# Patient Record
Sex: Female | Born: 2000 | Race: Black or African American | Hispanic: No | Marital: Single | Smoking: Never smoker
Health system: Southern US, Community
[De-identification: ages and names within clinical notes are randomized; demographics above are authoritative.]

## PROBLEM LIST (undated history)

## (undated) DIAGNOSIS — J45909 Unspecified asthma, uncomplicated: Secondary | ICD-10-CM

## (undated) HISTORY — PX: OTHER SURGICAL HISTORY: SHX169

---
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
## 2019-06-02 ENCOUNTER — Ambulatory Visit (LOCAL_COMMUNITY_HEALTH_CENTER): Payer: BC Managed Care – PPO | Admitting: Physician Assistant

## 2019-06-02 ENCOUNTER — Encounter: Payer: Self-pay | Admitting: Physician Assistant

## 2019-06-02 ENCOUNTER — Other Ambulatory Visit: Payer: Self-pay

## 2019-06-02 VITALS — BP 122/68 | Ht 66.0 in | Wt 130.0 lb

## 2019-06-02 DIAGNOSIS — Z3046 Encounter for surveillance of implantable subdermal contraceptive: Secondary | ICD-10-CM | POA: Diagnosis not present

## 2019-06-02 DIAGNOSIS — Z30011 Encounter for initial prescription of contraceptive pills: Secondary | ICD-10-CM

## 2019-06-02 DIAGNOSIS — Z3009 Encounter for other general counseling and advice on contraception: Secondary | ICD-10-CM | POA: Diagnosis not present

## 2019-06-02 MED ORDER — NORETHIN ACE-ETH ESTRAD-FE 1-20 MG-MCG PO TABS: 1.0000 | ORAL_TABLET | Freq: Every day | ORAL | 12 refills | Status: DC

## 2019-06-02 NOTE — Progress Notes (Signed)
Last physical at [facility] was 05/22/2018. Nexplanon insertion 02/11/2019. Pt to [facility] to have nexplanon removed due to headaches; pt desires low dose OCP. Pt states she had sore breast the whole time she was taking Cryselle, desires a different OCP. Last sex was approximately 1 month ago.

## 2019-06-02 NOTE — Progress Notes (Signed)
Pt manual BP 122/68. Pt counseled and then provided Rx written by provider for Junel 1/20 Fe. Pt signed OCP consent. Provider orders completed.

## 2019-06-03 NOTE — Progress Notes (Signed)
Family Planning Visit- Repeat Yearly Visit  Subjective:  Daisy Schultz is a 18 y.o. being seen today for an well woman visit and to change birth control method.    She is currently using Nexplanon  for pregnancy prevention. Patient reports she does not  want a pregnancy in the next year. Patient  has Asthma on their problem list.  Chief Complaint  Patient presents with  . Contraception    desires nexplanon removal and start low dose OCP    Patient reports that she had had a lot of headaches since the Nexplanon insertion on 09/11/2018 and would like it removed.  Previously on OCs with breast tenderness so would like lower dose OC this time.  Last RP 04/2018, No periods with the Nexplanon, CBE due 2021 and pap due 2022. Reports that she was diagnosed with tension headaches by PCP.  Patient denies any changes to personal or family history since last RP.  Denies symptoms or concerns.  Declines STD screening today.   Does the patient desire a pregnancy in the next year? (OKQ flowsheet)  See flowsheet for other program required questions.   Body mass index is 20.98 kg/m. - Patient is eligible for diabetes screening based on BMI and age >89?  not applicable FY1O ordered? not applicable  Patient reports 1 of partners in last year. Desires STI screening?  No - .  Does the patient have a current or past history of drug use? No   No components found for: HCV]   Health Maintenance Due  Topic Date Due  . CHLAMYDIA SCREENING  02/07/2015  . HIV Screening  02/07/2015  . INFLUENZA VACCINE  01/25/2019  . TETANUS/TDAP  02/07/2019    Review of Systems  All other systems reviewed and are negative.   The following portions of the patient's history were reviewed and updated as appropriate: allergies, current medications, past family history, past medical history, past social history, past surgical history and problem list. Problem list updated.  Objective:   Vitals:   06/02/19 1316 06/02/19  1318 06/02/19 1430  BP: (!) 155/78 132/69 122/68  Weight: 130 lb (59 kg)    Height: 5\' 6"  (1.676 m)      Physical Exam Vitals signs and nursing note reviewed.  Constitutional:      General: She is not in acute distress.    Appearance: Normal appearance. She is normal weight.  HENT:     Head: Normocephalic and atraumatic.  Neck:     Musculoskeletal: Neck supple. No muscular tenderness.  Pulmonary:     Effort: Pulmonary effort is normal.  Abdominal:     Palpations: Abdomen is soft. There is no mass.     Tenderness: There is no abdominal tenderness. There is no guarding or rebound.  Lymphadenopathy:     Cervical: No cervical adenopathy.  Skin:    General: Skin is warm and dry.     Findings: No bruising, erythema, lesion or rash.  Neurological:     Mental Status: She is alert and oriented to person, place, and time.  Psychiatric:        Mood and Affect: Mood normal.        Behavior: Behavior normal.        Thought Content: Thought content normal.        Judgment: Judgment normal.       Assessment and Plan:  Daisy Schultz is a 18 y.o. female presenting to the [facility] Department for a /family planning  visit  Contraception counseling: Reviewed all forms of birth control options in the tiered based approach. available including abstinence; over the counter/barrier methods; hormonal contraceptive medication including pill, patch, ring, injection,contraceptive implant; hormonal and nonhormonal IUDs; permanent sterilization options including vasectomy and the various tubal sterilization modalities. Risks, benefits, and typical effectiveness rates were reviewed.  Questions were answered.  Written information was also given to the patient to review.  Patient desires to start OCs, this was prescribed for patient. She will follow up in  1 year and as needed for surveillance.  She was told to call with any further questions, or with any concerns about this method of  contraception.  Emphasized use of condoms 100% of the time for STI prevention.  Patient was not a candidate for ECP today.    1. Encounter for counseling regarding contraception Counseled patient that most likely the tension headaches are not related to Nexplanon and due to stressors. Patient still insists on removal today and starting OCs. Rec condoms with all sex for STD protection. BP recheck was normal.  Enc patient to monitor BP to insure that it stays in normal range and discussed what normal range is with patient. Rec d/c OCs and call clinic if has elevated BP and also to follow up with PCP.  2. Nexplanon removal Nexplanon Removal Patient identified, informed consent performed, consent signed.   Appropriate time out taken. Nexplanon site identified.  Area prepped in usual sterile fashon. 3 ml of 1% lidocaine with Epinephrine was used to anesthetize the area at the distal end of the implant and along implant site. A small stab incision was made right beside the implant on the distal portion.  The Nexplanon rod was grasped manually and removed without difficulty from left arm.  There was minimal blood loss. There were no complications.  Steri-strips were applied over the small incision.  A pressure bandage was applied to reduce any bruising.  The patient tolerated the procedure well and was given post procedure instructions.   Nexplanon:   Counseled patient to take OTC analgesic starting as soon as lidocaine starts to wear off and take regularly for at least 48 hr to decrease discomfort.  Specifically to take with food or milk to decrease stomach upset and for IB 600 mg (3 tablets) every 6 hrs; IB 800 mg (4 tablets) every 8 hrs; or Aleve 2 tablets every 12 hrs.    3. OCP (oral contraceptive pills) initiation Rx for Junel Fe 1/20 28 d 1 po daily at the same time with refills for 1 year written and given to patient to start today. Counseled patient to take at same time daily for most  effectiveness, use condoms if has to take late OC. Reviewed with patient when to call clinic for SE. - norethindrone-ethinyl estradiol (JUNEL FE 1/20) 1-20 MG-MCG tablet; Take 1 tablet by mouth daily.  Dispense: 1 Package; Refill: 12     No follow-ups on file.  No future appointments.  Matt Holmes, PA

## 2019-12-02 ENCOUNTER — Ambulatory Visit: Payer: BC Managed Care – PPO

## 2019-12-04 ENCOUNTER — Ambulatory Visit: Payer: Self-pay | Admitting: Physician Assistant

## 2019-12-04 ENCOUNTER — Other Ambulatory Visit: Payer: Self-pay

## 2019-12-04 DIAGNOSIS — Z113 Encounter for screening for infections with a predominantly sexual mode of transmission: Secondary | ICD-10-CM

## 2019-12-04 DIAGNOSIS — Z3202 Encounter for pregnancy test, result negative: Secondary | ICD-10-CM

## 2019-12-04 LAB — WET PREP FOR TRICH, YEAST, CLUE
Trichomonas Exam: NEGATIVE
Yeast Exam: NEGATIVE

## 2019-12-04 LAB — PREGNANCY, URINE: Preg Test, Ur: NEGATIVE

## 2019-12-07 ENCOUNTER — Encounter: Payer: Self-pay | Admitting: Physician Assistant

## 2019-12-07 NOTE — Progress Notes (Signed)
[facility] Department STI clinic/screening visit  Subjective:  Daisy Schultz is a 19 y.o. female being seen today for an STI screening visit. The patient reports they do not have symptoms.  Patient reports that they do not desire a pregnancy in the next year.   They reported they are not interested in discussing contraception today.  No LMP recorded. Patient has had an implant.   Patient has the following medical conditions:   Patient Active Problem List   Diagnosis Date Noted  . Asthma 03/23/2017    Chief Complaint  Patient presents with  . SEXUALLY TRANSMITTED DISEASE    screening    HPI  Patient reports that she is not having any symptoms but would like a screening today.  States that she has a history of asthma but does not take any medications regularly for this and has had no surgeries.  LMP 11/11/2019 and has not has a pap since she is not yet 19 yo.  Per patient she has not ever had a HIV test.   See flowsheet for further details and programmatic requirements.    The following portions of the patient's history were reviewed and updated as appropriate: allergies, current medications, past medical history, past social history, past surgical history and problem list.  Objective:  There were no vitals filed for this visit.  Physical Exam Constitutional:      General: She is not in acute distress.    Appearance: Normal appearance.  HENT:     Head: Normocephalic and atraumatic.     Comments: No nits, lice or hair loss. No cervical, supraclavicular or axillary adenopathy.    Mouth/Throat:     Mouth: Mucous membranes are moist.     Pharynx: Oropharynx is clear. No oropharyngeal exudate or posterior oropharyngeal erythema.  Eyes:     Conjunctiva/sclera: Conjunctivae normal.  Pulmonary:     Effort: Pulmonary effort is normal.  Abdominal:     Palpations: Abdomen is soft. There is no mass.     Tenderness: There is no abdominal tenderness. There is no guarding  or rebound.  Genitourinary:    General: Normal vulva.     Rectum: Normal.     Comments: External genitalia/pubic area without nits, lice, edema, erythema, lesions and inguinal adenopathy. Vagina with normal mucosa and discharge. Cervix without visible lesions. Uterus firm, mobile, nt, no masses, no CMT, no adnexal tenderness or fullness. Musculoskeletal:     Cervical back: Neck supple. No tenderness.  Skin:    General: Skin is warm and dry.     Findings: No bruising, erythema, lesion or rash.  Neurological:     Mental Status: She is alert and oriented to person, place, and time.  Psychiatric:        Mood and Affect: Mood normal.        Behavior: Behavior normal.        Thought Content: Thought content normal.        Judgment: Judgment normal.      Assessment and Plan:  Daisy Schultz is a 19 y.o. female presenting to the [facility] Department for STI screening  1. Screening for STD (sexually transmitted disease) Patient into clinic without symptoms. Reviewed with patient that her exam and wet mount were both normal and pregnancy test was negative.   No treatment indicated today. Rec condoms with all sex. Await test results.  Counseled that RN will call if needs to RTC for treatment once results are back. - WET  PREP FOR TRICH, YEAST, CLUE - Pregnancy, urine - Gonococcus culture - Chlamydia/Gonorrhea [place] [facility]     No follow-ups on file.  No future appointments.  Matt Holmes, PA

## 2019-12-09 LAB — GONOCOCCUS CULTURE

## 2020-02-12 ENCOUNTER — Ambulatory Visit: Payer: Self-pay

## 2020-06-21 NOTE — Progress Notes (Deleted)
    MyChart Video Visit    Virtual Visit via Video Note   This visit type was conducted due to national recommendations for restrictions regarding the COVID-19 Pandemic (e.g. social distancing) in an effort to limit this patient's exposure and mitigate transmission in our community. This patient is at least at moderate risk for complications without adequate follow up. This format is felt to be most appropriate for this patient at this time. Physical exam was limited by quality of the video and audio technology used for the visit.   Patient location: *** Provider location: ***  I discussed the limitations of evaluation and management by telemedicine and the availability of in person appointments. The patient expressed understanding and agreed to proceed.  Patient: Daisy Schultz   DOB: 2001/02/13   19 y.o. Female  MRN: [id number] Visit Date: 06/22/2020  Today's healthcare provider: Jairo Ben, FNP   No chief complaint on file.  Subjective    HPI  Patient presents today to establish care as a new patient.  {Show patient history (optional):23778::" "}  Medications: Outpatient Medications Prior to Visit  Medication Sig  . albuterol (PROVENTIL HFA;VENTOLIN HFA) 108 (90 BASE) MCG/ACT inhaler Inhale 2 puffs into the lungs every 4 (four) hours as needed for wheezing or shortness of breath.  . etonogestrel (NEXPLANON) 68 MG IMPL implant 1 each by Subdermal route once.  . norethindrone-ethinyl estradiol (JUNEL FE 1/20) 1-20 MG-MCG tablet Take 1 tablet by mouth daily.  . Norgestrel-Ethinyl Estradiol (CRYSELLE-28 PO)    No facility-administered medications prior to visit.    Review of Systems  {Heme  Chem  Endocrine  Serology  Results Review (optional):23779::" "}  Objective    There were no vitals taken for this visit. {Show previous vital signs (optional):23777::" "}  Physical Exam     Assessment & Plan     ***  No follow-ups on file.     I discussed the  assessment and treatment plan with the patient. The patient was provided an opportunity to ask questions and all were answered. The patient agreed with the plan and demonstrated an understanding of the instructions.   The patient was advised to call back or seek an in-person evaluation if the symptoms worsen or if the condition fails to improve as anticipated.  I provided *** minutes of non-face-to-face time during this encounter.  {provider attestation***:1}  Jairo Ben, FNP [facility] [phone] (phone) [phone] (fax)  [facility]

## 2020-06-22 ENCOUNTER — Ambulatory Visit: Payer: Self-pay

## 2020-06-22 ENCOUNTER — Telehealth: Payer: BC Managed Care – PPO | Admitting: Adult Health

## 2020-06-22 NOTE — Telephone Encounter (Signed)
Pt c/o headache for 1 week. Headache is located at the sides and back of head. Pain has been constant and mild in intensity. No recent head injury. Pt stated she had similar headache when on Nexplanon (which was discontinued). Pt is not on birth control pills (is active on med list).  Pt has h/o tension headaches and has taken Fioricet in the past with good relief.  Pt denies weakness or numbness to face, arms or legs. No vision changes. No stiff neck, sore throat or cold sx. Pt stated she is going to get tested for covid today.  Pt does not have PCP right now. Pt has a future new patient appt 07/30/20 . Advised to go to [facility] if headache worsens.  Care advice provided and pt verbalized understanding.  Reason for Disposition . [1] MILD-MODERATE headache AND [2] present > 72 hours  Answer Assessment - Initial Assessment Questions 1. LOCATION: "Where does it hurt?"      Back and both sides of head 2. ONSET: "When did the headache start?" (Minutes, hours or days)      1 week ago 3. PATTERN: "Does the pain come and go, or has it been constant since it started?" constant 4. SEVERITY: "How bad is the pain?" and "What does it keep you from doing?"  (e.g., Scale 1-10; mild, moderate, or severe)   - MILD (1-3): doesn't interfere with normal activities    - MODERATE (4-7): interferes with normal activities or awakens from sleep    - SEVERE (8-10): excruciating pain, unable to do any normal activities        3= mild no 5. RECURRENT SYMPTOM: "Have you ever had headaches before?" If Yes, ask: "When was the last time?" and "What happened that time?"      Yes nexplanon related- has h/o tension headaches took Fiorcet 6. CAUSE: "What do you think is causing the headache?"     Pt doesn't know 7. MIGRAINE: "Have you been diagnosed with migraine headaches?" If Yes, ask: "Is this headache similar?"      No- no 8. HEAD INJURY: "Has there been any recent injury to the head?"      no 9. OTHER SYMPTOMS: "Do you have  any other symptoms?" (fever, stiff neck, eye pain, sore throat, cold symptoms)     Insomnia for 1 year-no other sx 10. PREGNANCY: "Is there any chance you are pregnant?" "When was your last menstrual period?"     No-LMP:06/05/20  Protocols used: HEADACHE-A-AH

## 2020-07-30 ENCOUNTER — Ambulatory Visit: Payer: Self-pay | Admitting: Adult Health

## 2020-09-23 ENCOUNTER — Ambulatory Visit (INDEPENDENT_AMBULATORY_CARE_PROVIDER_SITE_OTHER): Payer: Self-pay | Admitting: Primary Care

## 2021-01-05 ENCOUNTER — Other Ambulatory Visit: Payer: Self-pay

## 2021-01-05 ENCOUNTER — Ambulatory Visit (LOCAL_COMMUNITY_HEALTH_CENTER): Payer: No Typology Code available for payment source

## 2021-01-05 DIAGNOSIS — Z23 Encounter for immunization: Secondary | ICD-10-CM | POA: Diagnosis not present

## 2021-01-05 NOTE — Progress Notes (Signed)
Patient presents to nurse clinic for HPV and Varicella vaccines. Pt states she has no record of 2nd varicella vaccine or hx of chicken box. No record in epic or [facility].  She states childhood immunizations received from [facility]. Counseled pt on varicella vaccine and I instructed her to contact [employer] and the public school system to verify that no record of 2nd dose varicella vaccine. Pt agreeable with plan and will call to schedule appt for varicella vaccine if she is missing dose. HPV vaccine administered and tolerated well. [facility] updated and copy given to patient.

## 2021-02-16 ENCOUNTER — Other Ambulatory Visit: Payer: Self-pay

## 2021-02-16 ENCOUNTER — Ambulatory Visit (LOCAL_COMMUNITY_HEALTH_CENTER): Payer: No Typology Code available for payment source | Admitting: Physician Assistant

## 2021-02-16 VITALS — BP 105/67 | Ht 66.0 in | Wt 135.8 lb

## 2021-02-16 DIAGNOSIS — Z113 Encounter for screening for infections with a predominantly sexual mode of transmission: Secondary | ICD-10-CM

## 2021-02-16 DIAGNOSIS — Z01419 Encounter for gynecological examination (general) (routine) without abnormal findings: Secondary | ICD-10-CM

## 2021-02-16 DIAGNOSIS — Z3009 Encounter for other general counseling and advice on contraception: Secondary | ICD-10-CM | POA: Diagnosis not present

## 2021-02-16 DIAGNOSIS — Z30018 Encounter for initial prescription of other contraceptives: Secondary | ICD-10-CM | POA: Diagnosis not present

## 2021-02-16 DIAGNOSIS — Z124 Encounter for screening for malignant neoplasm of cervix: Secondary | ICD-10-CM

## 2021-02-16 NOTE — Progress Notes (Signed)
Patient here for a PE, providers orders completed.

## 2021-02-18 ENCOUNTER — Encounter: Payer: Self-pay | Admitting: Physician Assistant

## 2021-02-18 NOTE — Progress Notes (Signed)
[facility] [address] Main Number: [phone]    Family Planning Visit- Initial Visit  Subjective:  Daisy Schultz is a 20 y.o.  G0P0000   being seen today for an initial annual visit and to discuss contraceptive options.  The patient is currently using Condoms for pregnancy prevention. Patient reports she does not want a pregnancy in the next year.  Patient has the following medical conditions has Asthma on their problem list.  Chief Complaint  Patient presents with   Contraception    Annual visit     Patient reports that she is here for a physical, pap and STD screening.  Reports that she has not had any changes to her personal or family history since her last visit.  Reports that she is using and plans to continue with condoms/barrier as her BCM.  Reports that she has Asthma and has an inhaler that she uses as needed.  Per chart review, CBE and pap are due today.  Patient denies any concerns.   Body mass index is 21.92 kg/m. - Patient is eligible for diabetes screening based on BMI and age >46?  not applicable HA1C ordered? not applicable  Patient reports 1  partner/s in last year. Desires STI screening?  Yes  Has patient been screened once for HCV in the past?  No  No results found for: HCVAB  Does the patient have current drug use (including MJ), have a partner with drug use, and/or has been incarcerated since last result? No  If yes-- Screen for HCV through [facility]   Does the patient meet criteria for HBV testing? No  Criteria:  -Household, sexual or needle sharing contact with HBV -History of drug use -HIV positive -Those with known Hep C   Health Maintenance Due  Topic Date Due   COVID-19 Vaccine (1) Never done   CHLAMYDIA SCREENING  Never done   HIV Screening  Never done   Hepatitis C Screening  Never done   TETANUS/TDAP  Never done   INFLUENZA VACCINE  01/24/2021   HPV VACCINES (2 -  3-dose series) 02/02/2021   PAP-Cervical Cytology Screening  02/06/2021   PAP SMEAR-Modifier  02/06/2021    Review of Systems  All other systems reviewed and are negative.  The following portions of the patient's history were reviewed and updated as appropriate: allergies, current medications, past family history, past medical history, past social history, past surgical history and problem list. Problem list updated.   See flowsheet for other program required questions.  Objective:   Vitals:   02/16/21 1320  BP: 105/67  Weight: 135 lb 12.8 oz (61.6 kg)  Height: 5\' 6"  (1.676 m)    Physical Exam Vitals and nursing note reviewed.  Constitutional:      General: She is not in acute distress.    Appearance: Normal appearance.  HENT:     Head: Normocephalic and atraumatic.     Mouth/Throat:     Mouth: Mucous membranes are moist.     Pharynx: Oropharynx is clear. No oropharyngeal exudate or posterior oropharyngeal erythema.  Eyes:     Conjunctiva/sclera: Conjunctivae normal.  Neck:     Thyroid: No thyroid mass, thyromegaly or thyroid tenderness.  Cardiovascular:     Rate and Rhythm: Normal rate and regular rhythm.  Pulmonary:     Effort: Pulmonary effort is normal.     Breath sounds: Normal breath sounds.  Chest:  Breasts:    Right:  Normal. No mass, nipple discharge, skin change or tenderness.     Left: Normal. No mass, nipple discharge, skin change or tenderness.  Abdominal:     Palpations: Abdomen is soft. There is no mass.     Tenderness: There is no abdominal tenderness. There is no guarding or rebound.  Genitourinary:    General: Normal vulva.     Rectum: Normal.  Musculoskeletal:     Cervical back: Neck supple. No tenderness.  Lymphadenopathy:     Cervical: No cervical adenopathy.     Upper Body:     Right upper body: No supraclavicular, axillary or pectoral adenopathy.     Left upper body: No supraclavicular, axillary or pectoral adenopathy.  Skin:     General: Skin is warm and dry.     Findings: No bruising, erythema, lesion or rash.  Neurological:     Mental Status: She is alert and oriented to person, place, and time.  Psychiatric:        Mood and Affect: Mood normal.        Behavior: Behavior normal.        Thought Content: Thought content normal.        Judgment: Judgment normal.      Assessment and Plan:  Daisy Schultz is a 20 y.o. female presenting to the [facility] Department for an initial annual wellness/contraceptive visit  Contraception counseling: Reviewed all forms of birth control options in the tiered based approach. available including abstinence; over the counter/barrier methods; hormonal contraceptive medication including pill, patch, ring, injection,contraceptive implant, ECP; hormonal and nonhormonal IUDs; permanent sterilization options including vasectomy and the various tubal sterilization modalities. Risks, benefits, and typical effectiveness rates were reviewed.  Questions were answered.  Written information was also given to the patient to review.  Patient desires to continue with barrier/condoms as her [facility], this was prescribed for patient. She will follow up in  1 year and prn for surveillance.  She was told to call with any further questions, or with any concerns about this method of contraception.  Emphasized use of condoms 100% of the time for STI prevention.  Patient was not a candidate for ECP today.   1. Encounter for counseling regarding contraception Reviewed with patient as above re: BCM options. Reviewed with patient that she can RTC at any time if she changes her mind about hormonal BCM. Enc condoms with all sex for STD protection.   2. Screening for STD (sexually transmitted disease) Patient declines blood work today. Await test results.  Counseled that RN will call if needs to RTC for treatment once results are back. - Chlamydia/Gonorrhea [facility]  3. Well woman exam with  routine gynecological exam Reviewed with patient healthy habits to maintain general health. Enc MVI 1 po daily. Enc to establish with/ follow up with PCP for primary care concerns, age appropriate screenings and illness.   4. Papanicolaou smear Await pap results.  Counseled patient that RN will call or send letter once results are back with follow up plan/recommendations.  - Pap IG (Image Guided)  5. Evaluation for contraception barrier or spermicide Counseled patient to use condoms always and that she can also use OTC spermicides with condoms for added effectiveness.     Return in about 1 year (around 02/16/2022) for RP and prn.  No future appointments.  Matt Holmes, PA

## 2021-02-19 LAB — PAP IG (IMAGE GUIDED): PAP Smear Comment: 0

## 2021-03-14 DIAGNOSIS — Z23 Encounter for immunization: Secondary | ICD-10-CM

## 2022-04-27 ENCOUNTER — Encounter: Payer: Self-pay | Admitting: Advanced Practice Midwife

## 2022-04-27 ENCOUNTER — Ambulatory Visit: Payer: Self-pay | Admitting: Advanced Practice Midwife

## 2022-04-27 DIAGNOSIS — Z113 Encounter for screening for infections with a predominantly sexual mode of transmission: Secondary | ICD-10-CM

## 2022-04-27 LAB — WET PREP FOR TRICH, YEAST, CLUE
Trichomonas Exam: NEGATIVE
Yeast Exam: NEGATIVE

## 2022-04-27 LAB — HM HIV SCREENING LAB: HM HIV Screening: NEGATIVE

## 2022-04-27 NOTE — Progress Notes (Signed)
[facility] Department  STI clinic/screening visit [place] [address] [phone]  Subjective:  Daisy Schultz is a 21 y.o. SBF nullip nonsmoker female being seen today for an STI screening visit. The patient reports they do not have symptoms.  Patient reports that they do not desire a pregnancy in the next year.   They reported they are not interested in discussing contraception today.    No LMP recorded.   Patient has the following medical conditions:   Patient Active Problem List   Diagnosis Date Noted   Asthma 03/23/2017    Chief Complaint  Patient presents with   SEXUALLY TRANSMITTED DISEASE    No symptoms    HPI  Patient reports  asymptomatic. Last sex 04/15/22 without condom; with current partner x 7 mo; 1 partner  in last 3 mo. LMP 04/24/22. Last MJ 5 years ago. Last ETOH 04/21/22 (2 spiked lemonaides) qo weekend.   Does the patient using douching products? No  Last HIV test per patient/review of record was pt states never Patient reports last pap was 02/16/21 neg per pt   Screening for MPX risk: Does the patient have an unexplained rash? No Is the patient MSM? No Does the patient endorse multiple sex partners or anonymous sex partners? No Did the patient have close or sexual contact with a person diagnosed with MPX? No Has the patient traveled outside the [place] where MPX is endemic? No Is there a high clinical suspicion for MPX-- evidenced by one of the following No  -Unlikely to be chickenpox  -Lymphadenopathy  -Rash that present in same phase of evolution on any given body part See flowsheet for further details and programmatic requirements.   Immunization history:  Immunization History  Administered Date(s) Administered   HPV 9-valent 01/25/2018, 01/05/2021   Hepatitis B, PED/ADOLESCENT 09/23/1999, 03/12/2000, 11/06/2000   MMR 02/18/2001, 03/16/2005   Meningococcal B, OMV 01/25/2018   Meningococcal Mcv4o 01/25/2018    Pneumococcal Conjugate PCV 7 04/10/2000, 06/11/2000, 08/13/2000   Tdap 03/15/2011   Varicella 02/18/2001, 03/14/2021     The following portions of the patient's history were reviewed and updated as appropriate: allergies, current medications, past medical history, past social history, past surgical history and problem list.  Objective:  There were no vitals filed for this visit.  Physical Exam Vitals and nursing note reviewed.  Constitutional:      Appearance: Normal appearance. She is normal weight.  HENT:     Head: Normocephalic and atraumatic.     Mouth/Throat:     Mouth: Mucous membranes are moist.     Pharynx: Oropharynx is clear. No oropharyngeal exudate or posterior oropharyngeal erythema.  Eyes:     Conjunctiva/sclera: Conjunctivae normal.  Pulmonary:     Effort: Pulmonary effort is normal.  Abdominal:     General: Abdomen is flat.     Palpations: Abdomen is soft. There is no mass.     Tenderness: There is no abdominal tenderness. There is no rebound.     Comments: Soft without masses or tenderness, good tone  Genitourinary:    General: Normal vulva.     Exam position: Lithotomy position.     Pubic Area: No rash or pubic lice.      Labia:        Right: No rash or lesion.        Left: No rash or lesion.      Vagina: Vaginal discharge (very scant red blood in vagina, ph<4.5) present.  No erythema, bleeding or lesions.     Cervix: Normal.     Uterus: Normal.      Adnexa: Right adnexa normal and left adnexa normal.     Rectum: Normal.     Comments: pH = <4.5 Lymphadenopathy:     Head:     Right side of head: No preauricular or posterior auricular adenopathy.     Left side of head: No preauricular or posterior auricular adenopathy.     Cervical: No cervical adenopathy.     Right cervical: No superficial, deep or posterior cervical adenopathy.    Left cervical: No superficial, deep or posterior cervical adenopathy.     Upper Body:     Right upper body: No  supraclavicular, axillary or epitrochlear adenopathy.     Left upper body: No supraclavicular, axillary or epitrochlear adenopathy.     Lower Body: No right inguinal adenopathy. No left inguinal adenopathy.  Skin:    General: Skin is warm and dry.     Findings: No rash.  Neurological:     Mental Status: She is alert and oriented to person, place, and time.     Assessment and Plan:  Daisy Schultz is a 21 y.o. female presenting to the [place] for STI screening  1. Screening examination for venereal disease Treat wet mount per standing orders Immunization nurse consult  - WET PREP FOR [place], YEAST, CLUE - Gonococcus culture - Chlamydia/G[facility]orrhea [facility] - Syphilis Serology, [place] [facility] - HIV [place] [facility]     No follow-ups on file.  Future Appointments  Date Time Provider [place]  08/21/2022  8:40 AM Mikey Kirschner, PA-C [facility], CNM

## 2022-04-28 NOTE — Progress Notes (Signed)
Pt appointment for STI screening. Seen by Mable Fill. Initial lab results reviewed with pt.

## 2022-05-02 LAB — GONOCOCCUS CULTURE

## 2022-08-21 ENCOUNTER — Ambulatory Visit: Payer: Self-pay | Admitting: Physician Assistant

## 2022-08-21 NOTE — Progress Notes (Deleted)
I,Sha'taria Rage Beever,acting as a Education administrator for [employer], PA-C.,have documented all relevant documentation on the behalf of Mikey Kirschner, PA-C,as directed by  Mikey Kirschner, PA-C while in the presence of Mikey Kirschner, PA-C.   New patient visit   Patient: Daisy Schultz   DOB: Sep 09, 2000   22 y.o. Female  MRN: [id number] Visit Date: 08/21/2022  Today's healthcare provider: Mikey Kirschner, PA-C   No chief complaint on file.  Subjective    Daisy Schultz is a 22 y.o. female who presents today as a new patient to establish care.  HPI  ***  Past Medical History:  Diagnosis Date   Asthma    Past Surgical History:  Procedure Laterality Date   denies     Family Status  Relation Name Status   PGF  Alive   PGM  Alive   MGM  Alive   MGF  Alive   Father  Alive   Mother  Alive   Brother  Alive   Sister  Alive   Family History  Problem Relation Age of Onset   Diabetes Maternal Grandfather    Asthma Father    Social History   Socioeconomic History   Marital status: Single    Spouse name: Not on file   Number of children: Not on file   Years of education: Not on file   Highest education level: Not on file  Occupational History   Not on file  Tobacco Use   Smoking status: Never   Smokeless tobacco: Never  Vaping Use   Vaping Use: Never used  Substance and Sexual Activity   Alcohol use: Yes    Alcohol/week: 2.0 standard drinks of alcohol    Types: 2 Standard drinks or equivalent per week    Comment: last use 04/21/22 qo weekend   Drug use: Not Currently    Types: Marijuana    Comment: last use 2018   Sexual activity: Yes    Partners: Male    Birth control/protection: None  Other Topics Concern   Not on file  Social History Narrative   Not on file   Social Determinants of Health   Financial Resource Strain: Not on file  Food Insecurity: Not on file  Transportation Needs: Not on file  Physical Activity: Not on file  Stress: Not on file  Social  Connections: Not on file   Outpatient Medications Prior to Visit  Medication Sig   albuterol (PROVENTIL HFA;VENTOLIN HFA) 108 (90 BASE) MCG/ACT inhaler Inhale 2 puffs into the lungs every 4 (four) hours as needed for wheezing or shortness of breath.   etonogestrel (NEXPLANON) 68 MG IMPL implant 1 each by Subdermal route once.   Multiple Vitamin (MULTIVITAMIN) tablet Take 1 tablet by mouth daily.   norethindrone-ethinyl estradiol (JUNEL FE 1/20) 1-20 MG-MCG tablet Take 1 tablet by mouth daily. (Patient not taking: Reported on 04/27/2022)   Norgestrel-Ethinyl Estradiol (CRYSELLE-28 PO)    No facility-administered medications prior to visit.   No Known Allergies  Immunization History  Administered Date(s) Administered   HPV 9-valent 01/25/2018, 01/05/2021   Hepatitis B, PED/ADOLESCENT 03-16-2000, 03/12/2000, 11/06/2000   MMR 02/18/2001, 03/16/2005   Meningococcal B, OMV 01/25/2018   Meningococcal Mcv4o 01/25/2018   Pneumococcal Conjugate PCV 7 04/10/2000, 06/11/2000, 08/13/2000   Tdap 03/15/2011   Varicella 02/18/2001, 03/14/2021    Health Maintenance  Topic Date Due   COVID-19 Vaccine (1) Never done   CHLAMYDIA SCREENING  Never done   Hepatitis C Screening  Never done  DTaP/Tdap/Td (2 - Td or Tdap) 03/14/2021   HPV VACCINES (3 - 3-dose series) 03/30/2021   INFLUENZA VACCINE  Never done   PAP-Cervical Cytology Screening  02/17/2024   PAP SMEAR-Modifier  02/17/2024   HIV Screening  Completed    Patient Care Team: Pcp, No as PCP - General  Review of Systems  {Labs  Heme  Chem  Endocrine  Serology  Results Review (optional):23779}   Objective    There were no vitals taken for this visit. {Show previous vital signs (optional):23777}  Physical Exam ***  Depression Screen    02/16/2021    1:23 PM  PHQ 2/9 Scores  PHQ - 2 Score 0   No results found for any visits on 08/21/22.  Assessment & Plan     ***  No follow-ups on file.     {provider  attestation***:1}   Mikey Kirschner, PA-C  [place] [phone] (phone) [phone] (fax)  [place]

## 2022-12-11 ENCOUNTER — Ambulatory Visit: Payer: Self-pay | Admitting: Physician Assistant

## 2022-12-11 NOTE — Progress Notes (Deleted)
I,Rex Oesterle,acting as a Neurosurgeon for [employer], PA-C.,have documented all relevant documentation on the behalf of Alfredia Ferguson, PA-C,as directed by  Alfredia Ferguson, PA-C while in the presence of Alfredia Ferguson, PA-C.  New patient visit   Patient: Daisy Schultz   DOB: January 26, 2001   21 y.o. Female  MRN: [id number] Visit Date: 12/11/2022  Today's healthcare provider: Alfredia Ferguson, PA-C   No chief complaint on file.  Subjective    Daisy Schultz is a 22 y.o. female who presents today as a new patient to establish care.  HPI  ***  Past Medical History:  Diagnosis Date   Asthma    Past Surgical History:  Procedure Laterality Date   denies     Family Status  Relation Name Status   PGF  Alive   PGM  Alive   MGM  Alive   MGF  Alive   Father  Alive   Mother  Alive   Brother  Alive   Sister  Alive   Family History  Problem Relation Age of Onset   Diabetes Maternal Grandfather    Asthma Father    Social History   Socioeconomic History   Marital status: Single    Spouse name: Not on file   Number of children: Not on file   Years of education: Not on file   Highest education level: Not on file  Occupational History   Not on file  Tobacco Use   Smoking status: Never   Smokeless tobacco: Never  Vaping Use   Vaping Use: Never used  Substance and Sexual Activity   Alcohol use: Yes    Alcohol/week: 2.0 standard drinks of alcohol    Types: 2 Standard drinks or equivalent per week    Comment: last use 04/21/22 qo weekend   Drug use: Not Currently    Types: Marijuana    Comment: last use 2018   Sexual activity: Yes    Partners: Male    Birth control/protection: None  Other Topics Concern   Not on file  Social History Narrative   Not on file   Social Determinants of Health   Financial Resource Strain: Not on file  Food Insecurity: Not on file  Transportation Needs: Not on file  Physical Activity: Not on file  Stress: Not on file  Social  Connections: Not on file   Outpatient Medications Prior to Visit  Medication Sig   albuterol (PROVENTIL HFA;VENTOLIN HFA) 108 (90 BASE) MCG/ACT inhaler Inhale 2 puffs into the lungs every 4 (four) hours as needed for wheezing or shortness of breath.   etonogestrel (NEXPLANON) 68 MG IMPL implant 1 each by Subdermal route once.   Multiple Vitamin (MULTIVITAMIN) tablet Take 1 tablet by mouth daily.   norethindrone-ethinyl estradiol (JUNEL FE 1/20) 1-20 MG-MCG tablet Take 1 tablet by mouth daily. (Patient not taking: Reported on 04/27/2022)   Norgestrel-Ethinyl Estradiol (CRYSELLE-28 PO)    No facility-administered medications prior to visit.   No Known Allergies  Immunization History  Administered Date(s) Administered   HPV 9-valent 01/25/2018, 01/05/2021   Hepatitis B, PED/ADOLESCENT 10-Jun-2000, 03/12/2000, 11/06/2000   MMR 02/18/2001, 03/16/2005   Meningococcal B, OMV 01/25/2018   Meningococcal Mcv4o 01/25/2018   Pneumococcal Conjugate PCV 7 04/10/2000, 06/11/2000, 08/13/2000   Tdap 03/15/2011   Varicella 02/18/2001, 03/14/2021    Health Maintenance  Topic Date Due   CHLAMYDIA SCREENING  Never done   Hepatitis C Screening  Never done   DTaP/Tdap/Td (2 - Td or  Tdap) 03/14/2021   HPV VACCINES (3 - 3-dose series) 03/30/2021   COVID-19 Vaccine (3 - 2023-24 season) 02/24/2022   INFLUENZA VACCINE  01/25/2023   PAP-Cervical Cytology Screening  02/17/2024   PAP SMEAR-Modifier  02/17/2024   HIV Screening  Completed    Patient Care Team: Pcp, No as PCP - General  Review of Systems  {Labs  Heme  Chem  Endocrine  Serology  Results Review (optional):23779}   Objective    There were no vitals taken for this visit. {Show previous Denny Lave signs (optional):23777}  Physical Exam ***  Depression Screen    02/16/2021    1:23 PM  PHQ 2/9 Scores  PHQ - 2 Score 0   No results found for any visits on 12/11/22.  Assessment & Plan     ***  No follow-ups on file.      {provider attestation***:1}   Alfredia Ferguson, PA-C  [facility] [phone] (phone) [phone] (fax)  [facility]

## 2023-02-13 ENCOUNTER — Ambulatory Visit: Payer: Self-pay

## 2023-08-29 ENCOUNTER — Telehealth: Admitting: Nurse Practitioner

## 2023-08-29 DIAGNOSIS — N760 Acute vaginitis: Secondary | ICD-10-CM

## 2023-08-29 DIAGNOSIS — B9689 Other specified bacterial agents as the cause of diseases classified elsewhere: Secondary | ICD-10-CM

## 2023-08-29 MED ORDER — METRONIDAZOLE 0.75 % VA GEL
1.0000 | Freq: Every day | VAGINAL | 0 refills | Status: AC
Start: 2023-08-29 — End: 2023-09-05

## 2023-08-29 NOTE — Progress Notes (Signed)
 Virtual Visit Consent   Daisy Schultz, you are scheduled for a virtual visit with a [place] provider today. Just as with appointments in the office, your consent must be obtained to participate. Your consent will be active for this visit and any virtual visit you may have with one of our providers in the next 365 days. If you have a MyChart account, a copy of this consent can be sent to you electronically.  As this is a virtual visit, video technology does not allow for your provider to perform a traditional examination. This may limit your provider's ability to fully assess your condition. If your provider identifies any concerns that need to be evaluated in person or the need to arrange testing (such as labs, EKG, etc.), we will make arrangements to do so. Although advances in technology are sophisticated, we cannot ensure that it will always work on either your end or our end. If the connection with a video visit is poor, the visit may have to be switched to a telephone visit. With either a video or telephone visit, we are not always able to ensure that we have a secure connection.  By engaging in this virtual visit, you consent to the provision of healthcare and authorize for your insurance to be billed (if applicable) for the services provided during this visit. Depending on your insurance coverage, you may receive a charge related to this service.  I need to obtain your verbal consent now. Are you willing to proceed with your visit today? Daisy Schultz has provided verbal consent on 08/29/2023 for a virtual visit (video or telephone). Viviano Simas, FNP  Date: 08/29/2023 5:16 PM   Virtual Visit via Video Note   I, Viviano Simas, connected with  Daisy Schultz  ([id number], 2001-05-28) on 08/29/23 at  5:15 PM EST by a video-enabled telemedicine application and verified that I am speaking with the correct person using two identifiers.  Location: Patient: Virtual Visit Location Patient:  Home Provider: Virtual Visit Location Provider: Home Office   I discussed the limitations of evaluation and management by telemedicine and the availability of in person appointments. The patient expressed understanding and agreed to proceed.    History of Present Illness: Daisy Schultz is a 23 y.o. who identifies as a female who was assigned female at birth, and is being seen today for vaginal discharge that is thin white and has a foul smell   She has been trying probiotics to try to reverse the effects   Denies any concern for STI   Has had BV in the past   She is not on any birth control at the moment, in the past felt that both the pill and Nexplanon gave her SE    Problems:  Patient Active Problem List   Diagnosis Date Noted   Asthma 03/23/2017    Allergies: No Known Allergies Medications: None    Observations/Objective: Patient is well-developed, well-nourished in no acute distress.  Resting comfortably  at home.  Head is normocephalic, atraumatic.  No labored breathing.  Speech is clear and coherent with logical content.  Patient is alert and oriented at baseline.    Assessment and Plan:  1. Bacterial vaginitis (Primary)   Advised probiotic  Also advised follow up with OBGYN to discuss preventative    - metroNIDAZOLE (METROGEL) 0.75 % vaginal gel; Place 1 Applicatorful vaginally at bedtime for 7 days.  Dispense: 70 g; Refill: 0     Follow Up Instructions: I  discussed the assessment and treatment plan with the patient. The patient was provided an opportunity to ask questions and all were answered. The patient agreed with the plan and demonstrated an understanding of the instructions.  A copy of instructions were sent to the patient via MyChart unless otherwise noted below.    The patient was advised to call back or seek an in-person evaluation if the symptoms worsen or if the condition fails to improve as anticipated.    Viviano Simas, FNP

## 2023-10-08 ENCOUNTER — Ambulatory Visit (INDEPENDENT_AMBULATORY_CARE_PROVIDER_SITE_OTHER): Payer: Self-pay | Admitting: Certified Nurse Midwife

## 2023-10-08 ENCOUNTER — Encounter: Payer: Self-pay | Admitting: Obstetrics

## 2023-10-08 ENCOUNTER — Other Ambulatory Visit (HOSPITAL_COMMUNITY)
Admission: RE | Admit: 2023-10-08 | Discharge: 2023-10-08 | Disposition: A | Discharge status: Home / Self Care | Source: Ambulatory Visit | Attending: Certified Nurse Midwife | Admitting: Certified Nurse Midwife

## 2023-10-08 ENCOUNTER — Encounter: Payer: Self-pay | Admitting: Certified Nurse Midwife

## 2023-10-08 VITALS — BP 126/69 | HR 80 | Ht 68.0 in | Wt 176.9 lb

## 2023-10-08 DIAGNOSIS — Z124 Encounter for screening for malignant neoplasm of cervix: Secondary | ICD-10-CM | POA: Insufficient documentation

## 2023-10-08 DIAGNOSIS — Z01419 Encounter for gynecological examination (general) (routine) without abnormal findings: Secondary | ICD-10-CM

## 2023-10-08 NOTE — Patient Instructions (Signed)

## 2023-10-08 NOTE — Progress Notes (Signed)
 ANNUAL EXAM Patient name: Daisy Schultz MRN [id number]  Date of birth: [birth year] Chief Complaint:   Annual Exam  History of Present Illness:   Daisy Schultz is a 23 y.o. G0P0000 African-American female being seen today for a routine annual exam.  Current complaints: none, desires Pap & annual screening.  Patient's last menstrual period was 09/16/2023 (exact date).   Upstream - 10/08/23 1508       Pregnancy Intention Screening   Does the patient want to become pregnant in the next year? No    Does the patient's partner want to become pregnant in the next year? No    Would the patient like to discuss contraceptive options today? No      Contraception Wrap Up   Current Method Abstinence    End Method Abstinence;Female Condom            The pregnancy intention screening data noted above was reviewed. Potential methods of contraception were discussed. The patient elected to proceed with Abstinence; Female Condom.   No results found for: "DIAGPAP", "HPVHIGH", "ADEQPAP"     Last pap 2022. Results were:  negative . H/O abnormal pap: no Last mammogram: n/a. Results were: N/A. Family h/o breast cancer: no Last colonoscopy: n/a. Results were: N/A. Family h/o colorectal cancer: no     02/16/2021    1:23 PM  Depression screen PHQ 2/9  Decreased Interest 0  Down, Depressed, Hopeless 0  PHQ - 2 Score 0         No data to display           Review of Systems:   Pertinent items are noted in HPI Denies any headaches, blurred vision, fatigue, shortness of breath, chest pain, abdominal pain, abnormal vaginal discharge/itching/odor/irritation, problems with periods, bowel movements, urination, or intercourse unless otherwise stated above. Pertinent History Reviewed:  Reviewed past medical,surgical, social and family history.  Reviewed problem list, medications and allergies. Physical Assessment:   Vitals:   10/08/23 1510  BP: 126/69  Pulse: 80  Weight: 176 lb 14.4 oz (80.2  kg)  Height: 5\' 8"  (1.727 m)  Body mass index is 26.9 kg/m.       Physical Exam Vitals reviewed.  Constitutional:      Appearance: Normal appearance.  HENT:     Head: Normocephalic.  Neck:     Thyroid: No thyroid mass or thyromegaly.  Cardiovascular:     Rate and Rhythm: Normal rate and regular rhythm.     Heart sounds: Normal heart sounds.  Pulmonary:     Effort: Pulmonary effort is normal.     Breath sounds: Normal breath sounds.  Chest:  Breasts:    Tanner Score is 5.     Right: Normal.     Left: Normal.  Abdominal:     General: Abdomen is flat.     Palpations: Abdomen is soft.     Tenderness: There is no abdominal tenderness.  Genitourinary:    General: Normal vulva.     Tanner stage (genital): 5.     Vagina: Normal.     Cervix: Normal.  Musculoskeletal:     Cervical back: Neck supple. No tenderness.  Skin:    General: Skin is warm and dry.  Neurological:     General: No focal deficit present.     Mental Status: She is alert and oriented to person, place, and time.  Psychiatric:        Mood and Affect: Mood normal.  Behavior: Behavior normal.      No results found for this or any previous visit (from the past 24 hours).  Assessment & Plan:  1. Well woman exam (Primary)  2. Cervical cancer screening - Cytology - PAP   Mammogram: @ 24yo, or sooner if problems Colonoscopy: @ 24yo, or sooner if problems  No orders of the defined types were placed in this encounter.   Meds: No orders of the defined types were placed in this encounter.   Follow-up: Return in 1 year (on 10/07/2024) for Annual exam.  Forestine Igo, CNM 10/08/2023 3:54 PM

## 2023-10-12 LAB — CYTOLOGY - PAP
Diagnosis: NEGATIVE
Diagnosis: REACTIVE

## 2023-10-16 ENCOUNTER — Encounter: Payer: Self-pay | Admitting: Certified Nurse Midwife

## 2024-03-05 ENCOUNTER — Ambulatory Visit: Admitting: Certified Nurse Midwife

## 2024-03-21 ENCOUNTER — Telehealth: Admitting: Family Medicine

## 2024-03-21 DIAGNOSIS — N760 Acute vaginitis: Secondary | ICD-10-CM | POA: Diagnosis not present

## 2024-03-22 MED ORDER — FLUCONAZOLE 150 MG PO TABS
150.0000 mg | ORAL_TABLET | Freq: Once | ORAL | 0 refills | Status: AC
Start: 2024-03-22 — End: 2024-03-22

## 2024-03-22 NOTE — Progress Notes (Signed)
